# Patient Record
Sex: Male | Born: 1935 | Race: White | Hispanic: No | Marital: Single | Smoking: Never smoker
Health system: Southern US, Community
[De-identification: ages and names within clinical notes are randomized; demographics above are authoritative.]

## PROBLEM LIST (undated history)

## (undated) DIAGNOSIS — I639 Cerebral infarction, unspecified: Secondary | ICD-10-CM

## (undated) HISTORY — PX: VASCULAR SURGERY: SHX849

---

## 2001-10-28 ENCOUNTER — Encounter: Payer: Self-pay | Admitting: Urology

## 2001-10-28 ENCOUNTER — Encounter: Admission: RE | Admit: 2001-10-28 | Discharge: 2001-10-28 | Payer: Self-pay | Admitting: Urology

## 2005-04-05 ENCOUNTER — Emergency Department: Payer: Self-pay | Admitting: Emergency Medicine

## 2005-04-15 ENCOUNTER — Emergency Department: Payer: Self-pay | Admitting: Emergency Medicine

## 2005-05-08 ENCOUNTER — Emergency Department: Payer: Self-pay | Admitting: Emergency Medicine

## 2011-09-29 ENCOUNTER — Emergency Department: Payer: Self-pay | Admitting: *Deleted

## 2011-10-02 ENCOUNTER — Emergency Department: Payer: Self-pay | Admitting: Emergency Medicine

## 2011-10-02 LAB — COMPREHENSIVE METABOLIC PANEL
Albumin: 4.1 g/dL (ref 3.4–5.0)
Alkaline Phosphatase: 63 U/L (ref 50–136)
Anion Gap: 8 (ref 7–16)
BUN: 17 mg/dL (ref 7–18)
Bilirubin,Total: 1.5 mg/dL — ABNORMAL HIGH (ref 0.2–1.0)
Calcium, Total: 9.6 mg/dL (ref 8.5–10.1)
Chloride: 106 mmol/L (ref 98–107)
Co2: 26 mmol/L (ref 21–32)
Creatinine: 1.42 mg/dL — ABNORMAL HIGH (ref 0.60–1.30)
EGFR (African American): >60
EGFR (Non-African Amer.): 50 — ABNORMAL LOW
Glucose: 102 mg/dL — ABNORMAL HIGH (ref 65–99)
Osmolality: 281 (ref 275–301)
Potassium: 4.2 mmol/L (ref 3.5–5.1)
SGOT(AST): 25 U/L (ref 15–37)
SGPT (ALT): 24 U/L
Sodium: 140 mmol/L (ref 136–145)
Total Protein: 7.4 g/dL (ref 6.4–8.2)

## 2011-10-02 LAB — CBC
HCT: 38.4 % — ABNORMAL LOW (ref 40.0–52.0)
HGB: 13.1 g/dL (ref 13.0–18.0)
MCH: 32.2 pg (ref 26.0–34.0)
MCHC: 34 g/dL (ref 32.0–36.0)
MCV: 95 fL (ref 80–100)
Platelet: 175 10*3/uL (ref 150–440)
RBC: 4.07 10*6/uL — ABNORMAL LOW (ref 4.40–5.90)
RDW: 13.9 % (ref 11.5–14.5)
WBC: 9.4 10*3/uL (ref 3.8–10.6)

## 2011-10-02 LAB — LIPASE, BLOOD: Lipase: 96 U/L (ref 73–393)

## 2011-10-04 ENCOUNTER — Emergency Department: Payer: Self-pay | Admitting: Emergency Medicine

## 2014-10-01 ENCOUNTER — Ambulatory Visit
Admit: 2014-10-01 | Disposition: A | Discharge status: Home / Self Care | Payer: Self-pay | Attending: Family Medicine | Admitting: Family Medicine

## 2014-10-03 ENCOUNTER — Ambulatory Visit
Admit: 2014-10-03 | Disposition: A | Discharge status: Home / Self Care | Payer: Self-pay | Attending: Family Medicine | Admitting: Family Medicine

## 2014-10-07 ENCOUNTER — Emergency Department
Admit: 2014-10-07 | Disposition: A | Discharge status: Home / Self Care | Payer: Self-pay | Admitting: Emergency Medicine

## 2014-10-11 ENCOUNTER — Ambulatory Visit
Admit: 2014-10-11 | Disposition: A | Discharge status: Home / Self Care | Payer: Self-pay | Attending: Family Medicine | Admitting: Family Medicine

## 2014-10-15 ENCOUNTER — Ambulatory Visit
Admit: 2014-10-15 | Disposition: A | Discharge status: Home / Self Care | Payer: Self-pay | Attending: Family Medicine | Admitting: Family Medicine

## 2014-10-16 ENCOUNTER — Ambulatory Visit
Admit: 2014-10-16 | Disposition: A | Discharge status: Home / Self Care | Payer: Self-pay | Attending: Family Medicine | Admitting: Family Medicine

## 2016-11-21 ENCOUNTER — Emergency Department
Admission: EM | Admit: 2016-11-21 | Discharge: 2016-11-21 | Disposition: A | Discharge status: Home / Self Care | Payer: Medicare Other | Attending: Emergency Medicine | Admitting: Emergency Medicine

## 2016-11-21 DIAGNOSIS — W57XXXA Bitten or stung by nonvenomous insect and other nonvenomous arthropods, initial encounter: Secondary | ICD-10-CM | POA: Insufficient documentation

## 2016-11-21 DIAGNOSIS — Y9389 Activity, other specified: Secondary | ICD-10-CM | POA: Diagnosis not present

## 2016-11-21 DIAGNOSIS — Y9289 Other specified places as the place of occurrence of the external cause: Secondary | ICD-10-CM | POA: Diagnosis not present

## 2016-11-21 DIAGNOSIS — S70362A Insect bite (nonvenomous), left thigh, initial encounter: Secondary | ICD-10-CM | POA: Insufficient documentation

## 2016-11-21 DIAGNOSIS — Y999 Unspecified external cause status: Secondary | ICD-10-CM | POA: Diagnosis not present

## 2016-11-21 MED ORDER — DOXYCYCLINE HYCLATE 100 MG PO TABS
ORAL_TABLET | ORAL | Status: AC
  Administered 2016-11-21: 200 mg via ORAL
  Filled 2016-11-21: qty 2

## 2016-11-21 MED ORDER — DOXYCYCLINE HYCLATE 100 MG PO TABS
200.0000 mg | ORAL_TABLET | Freq: Once | ORAL | Status: AC
  Administered 2016-11-21: 200 mg via ORAL

## 2016-11-21 NOTE — ED Provider Notes (Signed)
[facility] Emergency Department Provider Note  ____________________________________________   First MD Initiated Contact with Patient 11/21/16 0104     (approximate)  I have reviewed the triage vital signs and the nursing notes.   HISTORY  Chief Complaint Insect Bite    HPI Randy Lester is a 83 y.o. male who comes to the emergency department because he felt a tick in his left upper thigh and wants it removed. He said he has been working outside recently several days ago he picked attack off his left upper arm. He's had no fevers or chills. No headache. No nausea or vomiting. No rash.   No past medical history on file.  There are no active problems to display for this patient.   No past surgical history on file.  Prior to Admission medications   Not on File    Allergies Penicillins  No family history on file.  Social History Social History  Substance Use Topics  . Smoking status: Not on file  . Smokeless tobacco: Not on file  . Alcohol use Not on file    Review of Systems Constitutional: No fever/chills ENT: No sore throat. Cardiovascular: Denies chest pain. Respiratory: Denies shortness of breath. Gastrointestinal: No abdominal pain.  No nausea, no vomiting.  No diarrhea.  No constipation. Musculoskeletal: Negative for back pain. Neurological: Negative for headaches   ____________________________________________   PHYSICAL EXAM:  VITAL SIGNS: ED Triage Vitals  Enc Vitals Group     BP 11/21/16 0057 (!) 162/73     Pulse Rate 11/21/16 0057 72     Resp 11/21/16 0057 20     Temp 11/21/16 0057 98.5 F (36.9 C)     Temp Source 11/21/16 0057 Oral     SpO2 11/21/16 0057 97 %     Weight 11/21/16 0055 148 lb (67.1 kg)     Height 11/21/16 0055 5\' 6"  (1.676 m)     Head Circumference --      Peak Flow --      Pain Score --      Pain Loc --      Pain Edu? --      Excl. in GC? --     Constitutional: Alert and oriented  x 4 well appearing nontoxic no diaphoresis speaks in full, clear sentences Head: Atraumatic. Nose: No congestion/rhinnorhea. Mouth/Throat: No trismus Neck: No stridor.   Cardiovascular: The rate and rhythm no murmurs Respiratory: Normal respiratory effort.  No retractions. Gastrointestinal: Soft nontender Neurologic:  Normal speech and language. No gross focal neurologic deficits are appreciated.  Skin:  Slight area of excoriation of her left upper thigh no foreign body identified no insect    ____________________________________________  LABS (all labs ordered are listed, but only abnormal results are displayed)  Labs Reviewed - No data to display   __________________________________________  EKG   ____________________________________________  RADIOLOGY   ____________________________________________   PROCEDURES  Procedure(s) performed: no  Procedures  Critical Care performed: no  Observation: no ____________________________________________   INITIAL IMPRESSION / ASSESSMENT AND PLAN / ED COURSE  Pertinent labs & imaging results that were available during my care of the patient were reviewed by me and considered in my medical decision making (see chart for details).  The patient no longer has an insect or a tick on his thigh. He may very well and had one because he scratched earlier and he may have inadvertently removed it. Regardless he says he clearly is had multiple ticks recently so I  will give him a prophylactic dose of doxycycline and strict return precautions.      ____________________________________________   FINAL CLINICAL IMPRESSION(S) / ED DIAGNOSES  Final diagnoses:  Tick bite, initial encounter      NEW MEDICATIONS STARTED DURING THIS VISIT:  There are no discharge medications for this patient.    Note:  This document was prepared using Dragon voice recognition software and may include unintentional dictation errors.        Merrily Brittle, MD 11/21/16 [phone]

## 2016-11-21 NOTE — ED Triage Notes (Signed)
Patient ambulatory to triage with steady gait, without difficulty or distress noted; pt reports here for tick on left thigh

## 2016-11-21 NOTE — Discharge Instructions (Signed)
Please establish care with a primary care physician as needed for recheck. Return to the emergency department for any new or worsening symptoms such as fevers chills chest pain shortness of breath rash or for any other concerns.  It was a pleasure to take care of you today, and thank you for coming to our emergency department.  If you have any questions or concerns before leaving please ask the nurse to grab me and I'm more than happy to go through your aftercare instructions again.  If you were prescribed any opioid pain medication today such as Norco, Vicodin, Percocet, morphine, hydrocodone, or oxycodone please make sure you do not drive when you are taking this medication as it can alter your ability to drive safely.  If you have any concerns once you are home that you are not improving or are in fact getting worse before you can make it to your follow-up appointment, please do not hesitate to call 911 and come back for further evaluation.  Merrily BrittleNeil Clanton Emanuelson MD

## 2017-03-30 ENCOUNTER — Encounter: Payer: Self-pay | Admitting: *Deleted

## 2017-03-30 ENCOUNTER — Ambulatory Visit
Admission: EM | Admit: 2017-03-30 | Discharge: 2017-03-30 | Disposition: A | Discharge status: Home / Self Care | Payer: Medicare Other

## 2017-03-30 DIAGNOSIS — T148XXA Other injury of unspecified body region, initial encounter: Secondary | ICD-10-CM

## 2017-03-30 HISTORY — DX: Cerebral infarction, unspecified: I63.9

## 2017-03-30 NOTE — Discharge Instructions (Signed)
Bacitracin antibiotic ointment twice daily

## 2017-03-30 NOTE — ED Triage Notes (Signed)
Pt had facial skin surgery yesterday afternoon. Site just in front of right ear persistent bleeding.

## 2017-03-30 NOTE — ED Provider Notes (Signed)
[facility]    CSN: [id number] Arrival date & time: 03/30/17  1444     History   Chief Complaint Chief Complaint  Patient presents with  . Wound Dehiscence    HPI Randy Lester is a 83 y.o. male.   83 yo male with a c/o bleeding from site of facial skin procedure done yesterday. States he saw the dermatologist yesterday and had a "scraping" done on a skin lesion on the right temple area. Patient takes aspirin regularly and had only stopped it for 4 days. States site started bleeding around noon today and has not stopped with pressure to the area.    The history is provided by the patient.    Past Medical History:  Diagnosis Date  . Stroke [facility])     There are no active problems to display for this patient.   Past Surgical History:  Procedure Laterality Date  . VASCULAR SURGERY         Home Medications    Prior to Admission medications   Medication Sig Start Date End Date Taking? Authorizing Provider  aspirin EC 81 MG tablet Take 81 mg by mouth daily.   Yes [provider]  atorvastatin (LIPITOR) 40 MG tablet Take 40 mg by mouth daily.   Yes [provider]  tamsulosin (FLOMAX) 0.4 MG CAPS capsule Take 0.4 mg by mouth.   Yes [provider]    Family History History reviewed. No pertinent family history.  Social History Social History  Substance Use Topics  . Smoking status: Never Smoker  . Smokeless tobacco: Never Used  . Alcohol use No     Allergies   Penicillins   Review of Systems Review of Systems   Physical Exam Triage Vital Signs ED Triage Vitals  Enc Vitals Group     BP 03/30/17 1543 (!) 162/65     Pulse Rate 03/30/17 1543 71     Resp 03/30/17 1543 16     Temp 03/30/17 1543 97.6 F (36.4 C)     Temp Source 03/30/17 1543 Oral     SpO2 03/30/17 1543 98 %     Weight 03/30/17 1545 145 lb (65.8 kg)     Height 03/30/17 1545  (1.676 m)     Head Circumference --      Peak Flow --    Pain Score --      Pain Loc --      Pain Edu? --      Excl. in GC? --    No data found.   Updated Vital Signs BP (!) 162/65 (BP Location: Left Arm)   Pulse 71   Temp 97.6 F (36.4 C) (Oral)   Resp 16   Ht  (1.676 m)   Wt 145 lb (65.8 kg)   SpO2 98%   BMI 23.40 kg/m   Visual Acuity Right Eye Distance:   Left Eye Distance:   Bilateral Distance:    Right Eye Near:   Left Eye Near:    Bilateral Near:     Physical Exam  Constitutional: He appears well-developed and well-nourished. No distress.  Skin: He is not diaphoretic.  Right lateral, upper cheek/temple area with deep skin open wound actively oozing blood  Nursing note and vitals reviewed.    UC Treatments / Results  Labs (all labs ordered are listed, but only abnormal results are displayed) Labs Reviewed - No data to display  EKG  EKG Interpretation None  Radiology No results found.  Procedures Procedures (including critical care time)  Medications Ordered in UC Medications - No data to display   Initial Impression / Assessment and Plan / UC Course  I have reviewed the triage vital signs and the nursing notes.  Pertinent labs & imaging results that were available during my care of the patient were reviewed by me and considered in my medical decision making (see chart for details).       Final Clinical Impressions(s) / UC Diagnoses   Final diagnoses:  Bleeding from wound    New Prescriptions Discharge Medication List as of 03/30/2017  4:18 PM     1. Bleeding stopped with application of silver nitrate. Gauze pressure dressing applied. Wound care instructions given. Follow up with dermatologist.   Controlled Substance Prescriptions [place] Controlled Substance Registry consulted? Not Applicable   Payton Mccallum, MD 03/30/17 [phone]

## 2017-06-24 ENCOUNTER — Other Ambulatory Visit: Payer: Self-pay

## 2017-06-24 ENCOUNTER — Ambulatory Visit
Admission: EM | Admit: 2017-06-24 | Discharge: 2017-06-24 | Disposition: A | Discharge status: Home / Self Care | Payer: Medicare Other | Attending: Family Medicine | Admitting: Family Medicine

## 2017-06-24 ENCOUNTER — Encounter: Payer: Self-pay | Admitting: *Deleted

## 2017-06-24 DIAGNOSIS — W19XXXA Unspecified fall, initial encounter: Secondary | ICD-10-CM | POA: Diagnosis not present

## 2017-06-24 DIAGNOSIS — S41112A Laceration without foreign body of left upper arm, initial encounter: Secondary | ICD-10-CM | POA: Diagnosis not present

## 2017-06-24 MED ORDER — MUPIROCIN 2 % EX OINT: TOPICAL_OINTMENT | CUTANEOUS | 0 refills | Status: DC

## 2017-06-24 NOTE — Discharge Instructions (Signed)
Use medication as prescribed. Keep clean.  Follow up with your primary care physician this week as needed. Return to Urgent care for redness, swelling, drainage, new or worsening concerns.

## 2017-06-24 NOTE — ED Provider Notes (Signed)
[facility] ____________________________________________  Time seen: Approximately 4:37 PM  I have reviewed the triage vital signs and the nursing notes.   HISTORY  Chief Complaint Laceration   HPI Randy Lester is a 83 y.o. male present with family at bedside for evaluation of left upper arm skin tear.  Patient and family reports that yesterday afternoon patient was laying on the couch in the process of getting up to open the door, his feet "got tangled up "causing him to trip and hit his left upper forearm on the coffee table.  Patient states that he did not fall from standing but from getting up from the couch.  States minimal pain to left upper arm and only at the area of skin tear.  Denies any other pain or injuries.  No head injury or loss of conscious.  Reports his continue with normal behavior and normal ambulatory status since.  Patient reports that he pulled some of the skin off yesterday.  Family report that they clean the area yesterday and applied dressing, but reports today when rechecking it had saturated the bandage and began bleeding again when trying to change the bandage, prompting for evaluation.  Patient states unsure of tetanus immunization status, but states that he believes his tetanus immunization is up-to-date.  Patient again reports he fell only because of tripping.  Denies syncope or near syncope.  Reports otherwise feels well.  Thea AlkenKeyserling, Thomas C, MD: PCP    Past Medical History:  Diagnosis Date  . Stroke [facility])     There are no active problems to display for this patient.   Past Surgical History:  Procedure Laterality Date  . VASCULAR SURGERY       No current facility-administered medications for this encounter.   Current Outpatient Medications:  .  amiodarone (PACERONE) 200 MG tablet, Take 200 mg by mouth daily., Disp: , Rfl:  .  apixaban (ELIQUIS) 5 MG TABS tablet, Take 5 mg by mouth daily., Disp: , Rfl:  .  atorvastatin  (LIPITOR) 40 MG tablet, Take 40 mg by mouth daily., Disp: , Rfl:  .  finasteride (PROSCAR) 5 MG tablet, Take 5 mg by mouth daily., Disp: , Rfl:  .  tamsulosin (FLOMAX) 0.4 MG CAPS capsule, Take 0.4 mg by mouth., Disp: , Rfl:  .  mupirocin ointment (BACTROBAN) 2 %, Apply two times a day for 7 days., Disp: 22 g, Rfl: 0  Allergies Penicillins  History reviewed. No pertinent family history.  Social History Social History   Tobacco Use  . Smoking status: Never Smoker  . Smokeless tobacco: Never Used  Substance Use Topics  . Alcohol use: No  . Drug use: No    Review of Systems Constitutional: No fever/chills Eyes: No visual changes. Cardiovascular: Denies chest pain. Respiratory: Denies shortness of breath. Gastrointestinal: No abdominal pain.   Musculoskeletal: Negative for back pain. Skin: Negative for rash. As above.  Neurological: Negative for headaches, focal weakness or numbness.   ____________________________________________   PHYSICAL EXAM:  VITAL SIGNS: ED Triage Vitals  Enc Vitals Group     BP 06/24/17 1536 (!) 112/49     Pulse Rate 06/24/17 1536 75     Resp 06/24/17 1536 16     Temp 06/24/17 1536 97.6 F (36.4 C)     Temp src --      SpO2 06/24/17 1536 96 %     Weight 06/24/17 1537 148 lb (67.1 kg)     Height 06/24/17 1537 5\' 5"  (1.651 m)  Head Circumference --      Peak Flow --      Pain Score 06/24/17 1537 0     Pain Loc --      Pain Edu? --      Excl. in GC? --     Constitutional: Alert and oriented. Well appearing and in no acute distress. Eyes: Conjunctivae are normal. ENT      Head: Normocephalic and atraumatic. Cardiovascular: Normal rate, regular rhythm. Grossly normal heart sounds.  Good peripheral circulation. Respiratory: Normal respiratory effort without tachypnea nor retractions. Breath sounds are clear and equal bilaterally. No wheezes, rales, rhonchi. Gastrointestinal: Soft and nontender. Musculoskeletal:   No midline cervical,  thoracic or lumbar tenderness to palpation. Bilateral distal radial pulses equal and easily palpated.  Bilateral hand grip strong and equal.      Right lower leg:  No tenderness or edema.      Left lower leg:  No tenderness or edema.  Neurologic:  Normal speech and language. No gross focal neurologic deficits are appreciated. Speech is normal.  No paresthesias. Skin:  Skin is warm, dry.      Except: See image above.  Patient with large superficial skin tear to distal lateral humerus, minimal tenderness to direct skin tear location, no bony tenderness, full range of motion to left upper extremity without pain full range of motion, no foreign bodies visualized, no active bleeding, no drainage,no surrounding erythema.  Psychiatric: Mood and affect are normal. Speech and behavior are normal. Patient exhibits appropriate insight and judgment   ___________________________________________   LABS (all labs ordered are listed, but only abnormal results are displayed)  Labs Reviewed - No data to display   PROCEDURES Procedures  Procedure(s) performed:  Procedure explained and verbal consent obtained. Consent: Verbal consent obtained. Written consent not obtained. Risks and benefits: risks, benefits and alternatives were discussed Patient identity confirmed: verbally with patient and hospital-assigned identification number  Consent given by: patient   Laceration Repair Location: left arm Length: approximately 7x8 cm Foreign bodies: no foreign bodies Tendon involvement: none Nerve involvement: none Preparation: Patient was prepped and draped in the usual sterile fashion. Anesthesia: none Irrigation solution: saline and betadine Irrigation method: jet lavage Amount of cleaning: copious Sterile forceps also utilized to unroll raveled thin skin tear skin to reapproximate Repaired: dermabond to lateral edges Approximation: loose Patient tolerate well. Wound well approximated post repair.    Antibiotic ointment and dressing applied.  Wound care instructions provided.  Observe for any signs of infection or other problems.      INITIAL IMPRESSION / ASSESSMENT AND PLAN / ED COURSE  Pertinent labs & imaging results that were available during my care of the patient were reviewed by me and considered in my medical decision making (see chart for details).  Very well-appearing patient.  Presenting with family at bedside post mechanical fall yesterday at home.  Large skin tear as above.  No bony tenderness and full range of motion present.  Doubt bony abnormality, patient agrees and declines x-ray.  Wound copiously cleaned and irrigated.  Wound margins at skin tear revised and reapproximated as able, but majority of skin pulled off by patient, and Dermabond used.  Topical antibiotic and dressing applied.  Will treat with topical Bactroban.  Encouraged rest, elevation, good wound care.  Discussed strict follow-up and return parameters.Discussed indication, risks and benefits of medications with patient.  By care everywhere, noted at [facility] 02-07-2016 tetanus immunization was updated.  Discussed follow up with Primary care physician  this week. Discussed follow up and return parameters including no resolution or any worsening concerns. Patient verbalized understanding and agreed to plan.   ____________________________________________   FINAL CLINICAL IMPRESSION(S) / ED DIAGNOSES  Final diagnoses:  Skin tear of left upper arm without complication, initial encounter     ED Discharge Orders        Ordered    mupirocin ointment (BACTROBAN) 2 %     06/24/17 1714       Note: This dictation was prepared with Dragon dictation along with smaller phrase technology. Any transcriptional errors that result from this process are unintentional.         Renford DillsMiller, Pleshette Tomasini, NP 06/24/17 1746

## 2017-06-24 NOTE — ED Triage Notes (Signed)
Patient fell against a table causing a skin laceration on his left upper arm yesterday. Patient removed the skin flap.

## 2017-10-19 ENCOUNTER — Ambulatory Visit
Admit: 2017-10-19 | Discharge: 2017-10-19 | Disposition: A | Discharge status: Home / Self Care | Payer: Medicare Other | Attending: Family Medicine | Admitting: Family Medicine

## 2017-10-19 ENCOUNTER — Encounter: Payer: Self-pay | Admitting: Emergency Medicine

## 2017-10-19 ENCOUNTER — Other Ambulatory Visit: Payer: Self-pay

## 2017-10-19 ENCOUNTER — Ambulatory Visit
Admission: EM | Admit: 2017-10-19 | Discharge: 2017-10-19 | Disposition: A | Discharge status: Home / Self Care | Payer: Medicare Other | Attending: Family Medicine | Admitting: Family Medicine

## 2017-10-19 DIAGNOSIS — Z87440 Personal history of urinary (tract) infections: Secondary | ICD-10-CM | POA: Insufficient documentation

## 2017-10-19 DIAGNOSIS — M436 Torticollis: Secondary | ICD-10-CM | POA: Insufficient documentation

## 2017-10-19 DIAGNOSIS — T148XXA Other injury of unspecified body region, initial encounter: Secondary | ICD-10-CM | POA: Diagnosis present

## 2017-10-19 DIAGNOSIS — Z8673 Personal history of transient ischemic attack (TIA), and cerebral infarction without residual deficits: Secondary | ICD-10-CM | POA: Insufficient documentation

## 2017-10-19 DIAGNOSIS — T07XXXA Unspecified multiple injuries, initial encounter: Secondary | ICD-10-CM

## 2017-10-19 DIAGNOSIS — Z880 Allergy status to penicillin: Secondary | ICD-10-CM | POA: Insufficient documentation

## 2017-10-19 DIAGNOSIS — S0512XA Contusion of eyeball and orbital tissues, left eye, initial encounter: Secondary | ICD-10-CM | POA: Insufficient documentation

## 2017-10-19 DIAGNOSIS — Z79899 Other long term (current) drug therapy: Secondary | ICD-10-CM | POA: Diagnosis not present

## 2017-10-19 DIAGNOSIS — I6782 Cerebral ischemia: Secondary | ICD-10-CM | POA: Insufficient documentation

## 2017-10-19 DIAGNOSIS — S40212A Abrasion of left shoulder, initial encounter: Secondary | ICD-10-CM

## 2017-10-19 DIAGNOSIS — M5021 Other cervical disc displacement,  high cervical region: Secondary | ICD-10-CM | POA: Diagnosis not present

## 2017-10-19 DIAGNOSIS — Z8546 Personal history of malignant neoplasm of prostate: Secondary | ICD-10-CM | POA: Diagnosis not present

## 2017-10-19 DIAGNOSIS — S59902A Unspecified injury of left elbow, initial encounter: Secondary | ICD-10-CM | POA: Diagnosis not present

## 2017-10-19 DIAGNOSIS — W19XXXA Unspecified fall, initial encounter: Secondary | ICD-10-CM | POA: Diagnosis not present

## 2017-10-19 DIAGNOSIS — S0990XA Unspecified injury of head, initial encounter: Secondary | ICD-10-CM | POA: Insufficient documentation

## 2017-10-19 DIAGNOSIS — Y92008 Other place in unspecified non-institutional (private) residence as the place of occurrence of the external cause: Secondary | ICD-10-CM | POA: Diagnosis not present

## 2017-10-19 DIAGNOSIS — W1830XA Fall on same level, unspecified, initial encounter: Secondary | ICD-10-CM | POA: Insufficient documentation

## 2017-10-19 DIAGNOSIS — G319 Degenerative disease of nervous system, unspecified: Secondary | ICD-10-CM | POA: Diagnosis not present

## 2017-10-19 DIAGNOSIS — M5030 Other cervical disc degeneration, unspecified cervical region: Secondary | ICD-10-CM | POA: Diagnosis not present

## 2017-10-19 DIAGNOSIS — E785 Hyperlipidemia, unspecified: Secondary | ICD-10-CM | POA: Diagnosis not present

## 2017-10-19 DIAGNOSIS — I480 Paroxysmal atrial fibrillation: Secondary | ICD-10-CM | POA: Diagnosis not present

## 2017-10-19 DIAGNOSIS — S59901A Unspecified injury of right elbow, initial encounter: Secondary | ICD-10-CM | POA: Diagnosis not present

## 2017-10-19 MED ORDER — MUPIROCIN 2 % EX OINT: 1.0000 "application " | TOPICAL_OINTMENT | Freq: Two times a day (BID) | CUTANEOUS | 0 refills | Status: AC

## 2017-10-19 NOTE — ED Triage Notes (Signed)
Called insurance co @ [phone] for pre-cert of CTs. No pre-cert required per automated voice system.

## 2017-10-19 NOTE — ED Provider Notes (Signed)
[facility]  CSN: [id number] Arrival date & time: 10/19/17  1554  History   Chief Complaint Chief Complaint  Patient presents with  . Fall    DOA 10/17/17   HPI  84 year old male who is currently on Eliquis presents for evaluation following a fall.  Patient states that he fell on 4/24.  He fell on his carport.  He fell on cement and injured his head.  He also suffered multiple wounds: Bilateral wrists & elbows, left anterior shoulder.  Patient states that he dressed his wounds but he continues to have some bleeding.  He denies headache.  No neck pain.  No reports of loss of consciousness.  He is primarily concerned about his wounds as opposed to his head injury.  He states that he is feeling well currently.  Additionally, he has a black eye (left).  No other associated symptoms.  No other complaints.  PMH: Hyperlipidemia, paroxysmal A. fib, history of UTI, history of prostate cancer, history of TIA  Surgical Hx:  CAROTID ARTERY ANGIOPLASTY     KNEE SURGERY   left  Thumb and Finger surgery     EYE SURGERY   cataracts   Home Medications    Prior to Admission medications   Medication Sig Start Date End Date Taking? Authorizing Provider  amiodarone (PACERONE) 200 MG tablet Take 200 mg by mouth daily.   Yes [provider]  apixaban (ELIQUIS) 5 MG TABS tablet Take 5 mg by mouth daily.   Yes [provider]  atorvastatin (LIPITOR) 40 MG tablet Take 40 mg by mouth daily.   Yes [provider]  finasteride (PROSCAR) 5 MG tablet Take 5 mg by mouth daily.   Yes [provider]  tamsulosin (FLOMAX) 0.4 MG CAPS capsule Take 0.4 mg by mouth.   Yes [provider]  mupirocin ointment (BACTROBAN) 2 % Apply 1 application topically 2 (two) times daily for 7 days. 10/19/17 10/26/17  Tommie Samsook, Brynlee Pennywell G, DO   Family History COPD Sister    Dementia Sister     Heart disease Brother    Cancer Sister     Social History Social History     Tobacco Use  . Smoking status: Never Smoker  . Smokeless tobacco: Never Used  Substance Use Topics  . Alcohol use: No  . Drug use: No    Allergies   Penicillins   Review of Systems Review of Systems  HENT:       Head injury.   Eyes:       Left black eye.  Musculoskeletal: Negative for neck pain.  Skin: Positive for wound.   Physical Exam Triage Vital Signs ED Triage Vitals  Enc Vitals Group     BP 10/19/17 1607 (!) 135/59     Pulse Rate 10/19/17 1607 64     Resp 10/19/17 1607 16     Temp 10/19/17 1607 (!) 97.5 F (36.4 C)     Temp Source 10/19/17 1607 Oral     SpO2 10/19/17 1607 97 %     Weight 10/19/17 1608 148 lb (67.1 kg)     Height 10/19/17 1608 5\' 6"  (1.676 m)     Head Circumference --      Peak Flow --      Pain Score 10/19/17 1608 0     Pain Loc --      Pain Edu? --      Excl. in GC? --    Updated Vital Signs BP (!) 135/59 (  BP Location: Right Arm)   Pulse 64   Temp (!) 97.5 F (36.4 C) (Oral)   Resp 16   Ht 5\' 6"  (1.676 m)   Wt 148 lb (67.1 kg)   SpO2 97%   BMI 23.89 kg/m   Physical Exam  Constitutional: He is oriented to person, place, and time. He appears well-developed. No distress.  HENT:  Head: Normocephalic and atraumatic.  Nose: Nose normal.  Mouth/Throat: Oropharynx is clear and moist.  Normal TMs bilaterally.  Eyes:  Ecchymosis of the left eye.  Neck: Normal range of motion. Neck supple.  No spinal tenderness.  Cardiovascular: Normal rate and regular rhythm.  Pulmonary/Chest: Effort normal and breath sounds normal. He has no wheezes. He has no rales.  Abdominal: Soft. He exhibits no distension. There is no tenderness.  Neurological: He is alert and oriented to person, place, and time.  Extraocular movements appear to be grossly intact.  Normal upper extremity muscle strength.  No apparent focal neurological deficits.  Skin:  Patient has several skin tears: left & right wrist, left & right elbows.  Patient also has an abrasion  of the left anterior shoulder.  Psychiatric: He has a normal mood and affect. His behavior is normal.  Nursing note and vitals reviewed.  UC Treatments / Results  Labs (all labs ordered are listed, but only abnormal results are displayed) Labs Reviewed - No data to display  EKG None Radiology Ct Head Wo Contrast  Result Date: 10/19/2017 CLINICAL DATA:  Larey Seat on Wednesday evening, soreness all over, stiff neck, LEFT eye contusion, history of stroke EXAM: CT HEAD WITHOUT CONTRAST CT MAXILLOFACIAL WITHOUT CONTRAST CT CERVICAL SPINE WITHOUT CONTRAST TECHNIQUE: Multidetector CT imaging of the head, cervical spine, and maxillofacial structures were performed using the standard protocol without intravenous contrast. Multiplanar CT image reconstructions of the cervical spine and maxillofacial structures were also generated. Right side of face marked with BB. COMPARISON:  CT head 10/07/2014 FINDINGS: CT HEAD FINDINGS Brain: Generalized atrophy. Ex vacuo dilatation of the frontal horn of the RIGHT lateral ventricle secondary to large old basal ganglia infarct. Small vessel chronic ischemic changes of deep cerebral white matter. No midline shift. No intracranial hemorrhage, mass lesion, or evidence of acute infarction. No extra-axial fluid collections. Vascular: Atherosclerotic calcification of internal carotid and vertebral arteries bilaterally at skull base. Skull: Demineralized but intact Other: N/A CT MAXILLOFACIAL FINDINGS Osseous: Facial bones intact. TMJ alignment normal bilaterally. No facial bone fractures identified. Small benign-appearing sclerotic focus in the LEFT frontal bone. Orbits: Bony orbits intact.  Intraorbital soft tissue planes clear. Sinuses: Paranasal sinuses, mastoid air cells and middle ear cavities clear Soft tissues: No significant soft tissue abnormalities. Linear high attenuation foreign body at the LEFT carotid artery likely synthetic patch from prior carotid surgery. CT CERVICAL  SPINE FINDINGS Alignment: Minimal retrolisthesis at C3-C4. Remaining alignments normal. Skull base and vertebrae: Bones demineralized. Visualized skull base intact. Scattered facet degenerative changes. Vertebral body heights maintained without fracture or subluxation. Encroachment upon multiple cervical neural foramina bilaterally by uncovertebral spurs. Soft tissues and spinal canal: Prevertebral soft tissues normal thickness. Remaining cervical soft tissues unremarkable. Disc levels: Mildly bulging disc at C2-C3. Central disc herniation questioned at C3-C4. Upper chest: Lung apices not visualized. Other: Atherosclerotic calcifications within the carotid systems bilaterally with evidence of prior surgical repair of the LEFT carotid. IMPRESSION: Atrophy with small vessel chronic ischemic changes of deep cerebral white matter. Old RIGHT basal ganglia lacunar infarct with ex vacuo dilatation of the RIGHT lateral  ventricle. No acute intracranial abnormalities. No acute facial bone abnormalities. Multilevel degenerative disc and facet disease changes of the cervical spine as above. No acute cervical spine abnormalities. Electronically Signed   By: Ulyses Southward M.D.   On: 10/19/2017 18:15   Ct Cervical Spine Wo Contrast  Result Date: 10/19/2017 CLINICAL DATA:  Larey Seat on Wednesday evening, soreness all over, stiff neck, LEFT eye contusion, history of stroke EXAM: CT HEAD WITHOUT CONTRAST CT MAXILLOFACIAL WITHOUT CONTRAST CT CERVICAL SPINE WITHOUT CONTRAST TECHNIQUE: Multidetector CT imaging of the head, cervical spine, and maxillofacial structures were performed using the standard protocol without intravenous contrast. Multiplanar CT image reconstructions of the cervical spine and maxillofacial structures were also generated. Right side of face marked with BB. COMPARISON:  CT head 10/07/2014 FINDINGS: CT HEAD FINDINGS Brain: Generalized atrophy. Ex vacuo dilatation of the frontal horn of the RIGHT lateral ventricle  secondary to large old basal ganglia infarct. Small vessel chronic ischemic changes of deep cerebral white matter. No midline shift. No intracranial hemorrhage, mass lesion, or evidence of acute infarction. No extra-axial fluid collections. Vascular: Atherosclerotic calcification of internal carotid and vertebral arteries bilaterally at skull base. Skull: Demineralized but intact Other: N/A CT MAXILLOFACIAL FINDINGS Osseous: Facial bones intact. TMJ alignment normal bilaterally. No facial bone fractures identified. Small benign-appearing sclerotic focus in the LEFT frontal bone. Orbits: Bony orbits intact.  Intraorbital soft tissue planes clear. Sinuses: Paranasal sinuses, mastoid air cells and middle ear cavities clear Soft tissues: No significant soft tissue abnormalities. Linear high attenuation foreign body at the LEFT carotid artery likely synthetic patch from prior carotid surgery. CT CERVICAL SPINE FINDINGS Alignment: Minimal retrolisthesis at C3-C4. Remaining alignments normal. Skull base and vertebrae: Bones demineralized. Visualized skull base intact. Scattered facet degenerative changes. Vertebral body heights maintained without fracture or subluxation. Encroachment upon multiple cervical neural foramina bilaterally by uncovertebral spurs. Soft tissues and spinal canal: Prevertebral soft tissues normal thickness. Remaining cervical soft tissues unremarkable. Disc levels: Mildly bulging disc at C2-C3. Central disc herniation questioned at C3-C4. Upper chest: Lung apices not visualized. Other: Atherosclerotic calcifications within the carotid systems bilaterally with evidence of prior surgical repair of the LEFT carotid. IMPRESSION: Atrophy with small vessel chronic ischemic changes of deep cerebral white matter. 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 No acute intracranial abnormalities. No acute facial bone abnormalities. Multilevel degenerative  disc and facet disease changes of the cervical spine as above. No acute cervical spine abnormalities. Electronically Signed   By: Ulyses Southward M.D.   On: 10/19/2017 18:15   Ct Maxillofacial Wo Contrast  Result Date: 10/19/2017 CLINICAL DATA:  Larey Seat on Wednesday evening, soreness all over, stiff neck, LEFT eye contusion, history of stroke EXAM: CT HEAD WITHOUT CONTRAST CT MAXILLOFACIAL WITHOUT CONTRAST CT CERVICAL SPINE WITHOUT CONTRAST TECHNIQUE: Multidetector CT imaging of the head, cervical spine, and maxillofacial structures were performed using the standard protocol without intravenous contrast. Multiplanar CT image reconstructions of the cervical spine and maxillofacial structures were also generated. Right side of face marked with BB. COMPARISON:  CT head 10/07/2014 FINDINGS: CT HEAD FINDINGS Brain: Generalized atrophy. Ex vacuo dilatation of the frontal horn of the RIGHT lateral ventricle secondary to large old basal ganglia infarct. Small vessel chronic ischemic changes of deep cerebral white matter. No midline shift. No intracranial hemorrhage, mass lesion, or evidence of acute infarction. No extra-axial fluid collections. Vascular: Atherosclerotic calcification of internal carotid and vertebral arteries bilaterally at skull base. Skull: Demineralized but intact  Other: N/A CT MAXILLOFACIAL FINDINGS Osseous: Facial bones intact. TMJ alignment normal bilaterally. No facial bone fractures identified. Small benign-appearing sclerotic focus in the LEFT frontal bone. Orbits: Bony orbits intact.  Intraorbital soft tissue planes clear. Sinuses: Paranasal sinuses, mastoid air cells and middle ear cavities clear Soft tissues: No significant soft tissue abnormalities. Linear high attenuation foreign body at the LEFT carotid artery likely synthetic patch from prior carotid surgery. CT CERVICAL SPINE FINDINGS Alignment: Minimal retrolisthesis at C3-C4. Remaining alignments normal. Skull base and vertebrae: Bones  demineralized. Visualized skull base intact. Scattered facet degenerative changes. Vertebral body heights maintained without fracture or subluxation. Encroachment upon multiple cervical neural foramina bilaterally by uncovertebral spurs. Soft tissues and spinal canal: Prevertebral soft tissues normal thickness. Remaining cervical soft tissues unremarkable. Disc levels: Mildly bulging disc at C2-C3. Central disc herniation questioned at C3-C4. Upper chest: Lung apices not visualized. Other: Atherosclerotic calcifications within the carotid systems bilaterally with evidence of prior surgical repair of the LEFT carotid. IMPRESSION: Atrophy with small vessel chronic ischemic changes of deep cerebral white matter. 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 No acute intracranial abnormalities. No acute facial bone abnormalities. Multilevel degenerative disc and facet disease changes of the cervical spine as above. No acute cervical spine abnormalities. Electronically Signed   By: Ulyses Southward M.D.   On: 10/19/2017 18:15    Procedures Procedures (including critical care time)  Medications Ordered in UC Medications - No data to display   Initial Impression / Assessment and Plan / UC Course  I have reviewed the triage vital signs and the nursing notes.  Pertinent labs & imaging results that were available during my care of the patient were reviewed by me and considered in my medical decision making (see chart for details).    84 year old male presents following a fall.  Patient with multiple wounds.  They were dressed with antibiotic ointment, nonstick, and Coban.  Given injuries, age, and anticoagulant use CT imaging was obtained.  CT imaging was negative.  Final Clinical Impressions(s) / UC Diagnoses   Final diagnoses:  Injury of head, initial encounter  Multiple wounds    ED Discharge Orders        Ordered    mupirocin ointment (BACTROBAN) 2 %   2 times daily     10/19/17 1821     Controlled Substance Prescriptions [place] Controlled Substance Registry consulted? Not Applicable   Anas, Reister, DO 10/19/17 1826

## 2017-10-19 NOTE — Discharge Instructions (Signed)
Keep wounds dressed.  Follow up with your primary.  Take care  Dr. Adriana Simasook

## 2017-10-19 NOTE — ED Triage Notes (Signed)
Patient in today stating that he fell on 10/17/17 and hit his head and elbows on cement. Left elbow is still bleeding and patient hasn't been able to get it to stop.

## 2017-10-22 ENCOUNTER — Ambulatory Visit: Payer: Medicare Other

## 2018-03-06 ENCOUNTER — Other Ambulatory Visit: Payer: Self-pay

## 2018-03-06 ENCOUNTER — Encounter: Payer: Self-pay | Admitting: Emergency Medicine

## 2018-03-06 ENCOUNTER — Ambulatory Visit
Admission: EM | Admit: 2018-03-06 | Discharge: 2018-03-06 | Disposition: A | Discharge status: Home / Self Care | Payer: Medicare Other | Attending: Family Medicine | Admitting: Family Medicine

## 2018-03-06 DIAGNOSIS — W19XXXA Unspecified fall, initial encounter: Secondary | ICD-10-CM | POA: Diagnosis not present

## 2018-03-06 DIAGNOSIS — T148XXA Other injury of unspecified body region, initial encounter: Principal | ICD-10-CM

## 2018-03-06 DIAGNOSIS — L089 Local infection of the skin and subcutaneous tissue, unspecified: Secondary | ICD-10-CM | POA: Diagnosis not present

## 2018-03-06 DIAGNOSIS — S40812A Abrasion of left upper arm, initial encounter: Secondary | ICD-10-CM

## 2018-03-06 MED ORDER — BACITRACIN ZINC 500 UNIT/GM EX OINT: 1.0000 "application " | TOPICAL_OINTMENT | Freq: Two times a day (BID) | CUTANEOUS | 0 refills | Status: DC

## 2018-03-06 MED ORDER — DOXYCYCLINE HYCLATE 100 MG PO TABS: 100.0000 mg | ORAL_TABLET | Freq: Two times a day (BID) | ORAL | 0 refills | Status: DC

## 2018-03-06 NOTE — ED Triage Notes (Signed)
Patient states that he fell at his sisters house on Friday afternoon.  Patient has a skin tear to his left arm.  Patient c/o pain in his left arm.  Patient denies hitting his head.

## 2018-03-06 NOTE — ED Provider Notes (Signed)
[facility]    CSN: [id number] Arrival date & time: 03/06/18  1133     History   Chief Complaint Chief Complaint  Patient presents with  . Fall  . Skin Tear    left arm    HPI Randy Lester is a 84 y.o. male.   84 yo male with a c/o a skin tear on his left upper arm after falling 6 days ago. States has been applying triple antibiotic ointment to the area. Denies any fevers or chills.   The history is provided by the patient.  Fall     Past Medical History:  Diagnosis Date  . Stroke [facility])     There are no active problems to display for this patient.   Past Surgical History:  Procedure Laterality Date  . VASCULAR SURGERY         Home Medications    Prior to Admission medications   Medication Sig Start Date End Date Taking? Authorizing Provider  amiodarone (PACERONE) 200 MG tablet Take 200 mg by mouth daily.   Yes [provider]  aspirin 81 MG chewable tablet Chew 81 mg by mouth daily.   Yes [provider]  atorvastatin (LIPITOR) 40 MG tablet Take 40 mg by mouth daily.   Yes [provider]  finasteride (PROSCAR) 5 MG tablet Take 5 mg by mouth daily.   Yes [provider]  tamsulosin (FLOMAX) 0.4 MG CAPS capsule Take 0.4 mg by mouth.   Yes [provider]  apixaban (ELIQUIS) 5 MG TABS tablet Take 5 mg by mouth daily.    [provider]  bacitracin ointment Apply 1 application topically 2 (two) times daily. 03/06/18   Payton Mccallum, MD  doxycycline (VIBRA-TABS) 100 MG tablet Take 1 tablet (100 mg total) by mouth 2 (two) times daily. 03/06/18   Payton Mccallum, MD    Family History History reviewed. No pertinent family history.  Social History Social History   Tobacco Use  . Smoking status: Never Smoker  . Smokeless tobacco: Never Used  Substance Use Topics  . Alcohol use: No  . Drug use: No     Allergies   Penicillins   Review of Systems Review of Systems   Physical  Exam Triage Vital Signs ED Triage Vitals  Enc Vitals Group     BP 03/06/18 1151 102/67     Pulse Rate 03/06/18 1151 63     Resp 03/06/18 1151 16     Temp 03/06/18 1151 97.7 F (36.5 C)     Temp Source 03/06/18 1151 Oral     SpO2 03/06/18 1151 95 %     Weight 03/06/18 1147 147 lb (66.7 kg)     Height 03/06/18 1147 5\' 5"  (1.651 m)     Head Circumference --      Peak Flow --      Pain Score 03/06/18 1147 7     Pain Loc --      Pain Edu? --      Excl. in GC? --    No data found.  Updated Vital Signs BP 102/67 (BP Location: Right Arm)   Pulse 63   Temp 97.7 F (36.5 C) (Oral)   Resp 16   Ht 5\' 5"  (1.651 m)   Wt 66.7 kg   SpO2 95%   BMI 24.46 kg/m   Visual Acuity Right Eye Distance:   Left Eye Distance:   Bilateral Distance:    Right Eye Near:   Left  Eye Near:    Bilateral Near:     Physical Exam  Constitutional: He appears well-developed and well-nourished. No distress.  Skin: He is not diaphoretic.  approx 10 x 6 cm superficial skin abrasion on left upper arm; no drainage; slight surrounding erythema and tenderness to palpation  Nursing note and vitals reviewed.    UC Treatments / Results  Labs (all labs ordered are listed, but only abnormal results are displayed) Labs Reviewed - No data to display  EKG None  Radiology No results found.  Procedures Procedures (including critical care time)  Medications Ordered in UC Medications - No data to display  Initial Impression / Assessment and Plan / UC Course  I have reviewed the triage vital signs and the nursing notes.  Pertinent labs & imaging results that were available during my care of the patient were reviewed by me and considered in my medical decision making (see chart for details).      Final Clinical Impressions(s) / UC Diagnoses   Final diagnoses:  Abrasion of skin with infection  (mild secondary infection)   Discharge Instructions     Continue routine wound care with topical  antibiotic    ED Prescriptions    Medication Sig Dispense Auth. Provider   doxycycline (VIBRA-TABS) 100 MG tablet Take 1 tablet (100 mg total) by mouth 2 (two) times daily. 14 tablet Evelyn Aguinaldo, Pamala Hurry, MD   bacitracin ointment Apply 1 application topically 2 (two) times daily. 120 g Payton Mccallum, MD     1. diagnosis reviewed with patient 2. rx as per orders above; reviewed possible side effects, interactions, risks and benefits  3. Recommend supportive treatment with continued wound care 4. Follow-up prn if symptoms worsen or don't improve    Controlled Substance Prescriptions [place] Controlled Substance Registry consulted? Not Applicable   Payton Mccallum, MD 03/06/18 1451

## 2018-03-06 NOTE — Discharge Instructions (Signed)
Continue routine wound care with topical antibiotic

## 2018-08-08 ENCOUNTER — Emergency Department: Payer: Medicare Other

## 2018-08-08 ENCOUNTER — Emergency Department
Admission: EM | Admit: 2018-08-08 | Discharge: 2018-08-09 | Disposition: A | Discharge status: Home / Self Care | Payer: Medicare Other | Attending: Emergency Medicine | Admitting: Emergency Medicine

## 2018-08-08 ENCOUNTER — Other Ambulatory Visit: Payer: Self-pay

## 2018-08-08 DIAGNOSIS — S63283A Dislocation of proximal interphalangeal joint of left middle finger, initial encounter: Secondary | ICD-10-CM | POA: Diagnosis not present

## 2018-08-08 DIAGNOSIS — Y998 Other external cause status: Secondary | ICD-10-CM | POA: Diagnosis not present

## 2018-08-08 DIAGNOSIS — Y9389 Activity, other specified: Secondary | ICD-10-CM | POA: Diagnosis not present

## 2018-08-08 DIAGNOSIS — S63289A Dislocation of proximal interphalangeal joint of unspecified finger, initial encounter: Secondary | ICD-10-CM

## 2018-08-08 DIAGNOSIS — Z8673 Personal history of transient ischemic attack (TIA), and cerebral infarction without residual deficits: Secondary | ICD-10-CM | POA: Diagnosis not present

## 2018-08-08 DIAGNOSIS — R9082 White matter disease, unspecified: Secondary | ICD-10-CM | POA: Diagnosis not present

## 2018-08-08 DIAGNOSIS — W19XXXA Unspecified fall, initial encounter: Secondary | ICD-10-CM | POA: Insufficient documentation

## 2018-08-08 DIAGNOSIS — Z79899 Other long term (current) drug therapy: Secondary | ICD-10-CM | POA: Insufficient documentation

## 2018-08-08 DIAGNOSIS — Y92129 Unspecified place in nursing home as the place of occurrence of the external cause: Secondary | ICD-10-CM | POA: Insufficient documentation

## 2018-08-08 DIAGNOSIS — S51012A Laceration without foreign body of left elbow, initial encounter: Secondary | ICD-10-CM

## 2018-08-08 DIAGNOSIS — S6992XA Unspecified injury of left wrist, hand and finger(s), initial encounter: Secondary | ICD-10-CM | POA: Diagnosis present

## 2018-08-08 NOTE — ED Triage Notes (Addendum)
Pt arrived via [facility] from [facility] with c/o fall. EMS states pt had an unwitnessed fall while apparently trying to reach for his walker. EMS states that pt denies LOC but has obvious deformity to left hand and skin tear on left elbow.

## 2018-08-08 NOTE — ED Notes (Signed)
Family at bedside. 

## 2018-08-08 NOTE — ED Provider Notes (Addendum)
[facility] Emergency Department Provider Note   ____________________________________________    I have reviewed the triage vital signs and the nursing notes.   HISTORY  Chief Complaint Fall   History limited by dementia  HPI Randy Lester is a 85 y.o. male who presents after an unwitnessed fall from nursing home.  Power of attorney is here, he reports patient has frequent falls.  Apparently the patient was thought to be reaching for his walker and lost his balance and fell onto his left side.  Overall the patient says he feels well.  Denies chest wall pain, no abdominal pain, no nausea or vomiting.  Has some pain in his left hand and an abrasion to his left elbow.  We do not have an up-to-date MAR at this time, Eliquis listed on his medications in epic however this seems unlikely to me given his frequent falls.  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 Stroke [facility])     There are no active problems to display for this patient.   Past Surgical History:  Procedure Laterality Date  . VASCULAR SURGERY      Prior to Admission medications   Medication Sig Start Date End Date Taking? Authorizing Provider  amiodarone (PACERONE) 200 MG tablet Take 200 mg by mouth daily.    [provider]  apixaban (ELIQUIS) 5 MG TABS tablet Take 5 mg by mouth daily.    [provider]  aspirin 81 MG chewable tablet Chew 81 mg by mouth daily.    [provider]  atorvastatin (LIPITOR) 40 MG tablet Take 40 mg by mouth daily.    [provider]  bacitracin ointment Apply 1 application topically 2 (two) times daily. 03/06/18   Payton Mccallum, MD  doxycycline (VIBRA-TABS) 100 MG tablet Take 1 tablet (100 mg total) by mouth 2 (two) times daily. 03/06/18   Payton Mccallum, MD  finasteride (PROSCAR) 5 MG tablet Take 5 mg by mouth daily.    [provider]  tamsulosin  (FLOMAX) 0.4 MG CAPS capsule Take 0.4 mg by mouth.    [provider]     Allergies Penicillins  History reviewed. No pertinent family history.  Social History Social History   Tobacco Use  . Smoking status: Never Smoker  . Smokeless tobacco: Never Used  Substance Use Topics  . Alcohol use: No  . Drug use: No    Review of Systems  Constitutional: No fever/chills Eyes: No visual changes.  ENT: No neck pain Cardiovascular: Denies chest pain. Respiratory: No difficulty breathing Gastrointestinal: No abdominal pain.    Genitourinary: Negative for groin injury Musculoskeletal: Negative for back pain.  Left hand pain Skin: Skin tear left elbow Neurological: No new weakness   ____________________________________________   PHYSICAL EXAM:  VITAL SIGNS: ED Triage Vitals [08/08/18 2243]  Enc Vitals Group     BP      Pulse      Resp      Temp      Temp src      SpO2      Weight 73.5 kg (162 lb)     Height 1.778 m (5\' 10" )     Head Circumference      Peak Flow      Pain Score 3     Pain Loc      Pain Edu?      Excl. in GC?  Constitutional: Alert. No acute distress. Pleasant and interactive Eyes: Conjunctivae are normal.  Head: Bruise to the crown, appears old Nose: No epistaxis or swelling Mouth/Throat: Mucous membranes are moist.   Neck:  Painless ROM, no vertebral tenderness to palpation cardiovascular: Normal rate. Grossly normal heart sounds.  Good peripheral circulation. Respiratory: Normal respiratory effort.  No retractions. Lungs CTAB. Gastrointestinal: Soft and nontender. No distention.   Musculoskeletal:   Warm and well perfused.  Left hand: Third finger suspect dislocation DIP.  No pain with axial load on both hips, full range of motion of all extremities without pain Neurologic:  Normal speech and language. No gross focal neurologic deficits are appreciated.  Skin:  Skin is warm, dry.  Skin tear to the left elbow Psychiatric: Mood and  affect are normal. Speech and behavior are normal.  ____________________________________________   LABS (all labs ordered are listed, but only abnormal results are displayed)  Labs Reviewed - No data to display ____________________________________________  EKG  ED ECG REPORT I, Jene Every, the attending physician, personally viewed and interpreted this ECG.  Date: 08/08/2018  Rhythm: normal sinus rhythm QRS Axis: normal Intervals: normal ST/T Wave abnormalities: Nonspecific changes   ____________________________________________  RADIOLOGY  CT Hand x-ray ____________________________________________   PROCEDURES  Procedure(s) performed: No  .Ortho Injury Treatment Date/Time: 08/09/2018 12:06 AM Performed by: Jene Every, MD Authorized by: Jene Every, MD   Consent:    Consent obtained:  Verbal   Consent given by:  Patient   Risks discussed:  Fracture and recurrent dislocation   Alternatives discussed:  No treatment and immobilizationInjury location: finger Location details: left long finger Injury type: dislocation Pre-procedure neurovascular assessment: neurovascularly intact Pre-procedure distal perfusion: normal Pre-procedure neurological function: normal Pre-procedure range of motion: normal Post-procedure neurovascular assessment: post-procedure neurovascularly intact Post-procedure distal perfusion: normal Post-procedure neurological function: normal Post-procedure range of motion: normal Patient tolerance: Patient tolerated the procedure well with no immediate complications      Critical Care performed: No ____________________________________________   INITIAL IMPRESSION / ASSESSMENT AND PLAN / ED COURSE  Pertinent labs & imaging results that were available during my care of the patient were reviewed by me and considered in my medical decision making (see chart for details).  Patient overall well-appearing and in no acute distress,  unwitnessed fall, questionable Eliquis, will obtain CT, x-ray of the left hand anticipate dislocation that will require relocation  X-ray confirmed left third PIP dislocation, reduction performed without difficulty, patient tolerated well.  CT negative.  Patient appropriate for discharge at this time    ____________________________________________   FINAL CLINICAL IMPRESSION(S) / ED DIAGNOSES  Final diagnoses:  Fall, initial encounter  Dislocation of finger PIP joint, initial encounter  Skin tear of left elbow without complication, initial encounter        Note:  This document was prepared using Dragon voice recognition software and may include unintentional dictation errors.   Jene Every, MD 08/09/18 Jorje Guild    Jene Every, MD 08/09/18 [id number]

## 2018-08-09 NOTE — ED Notes (Signed)
Family verbalized understanding of d/c instructions and f/u care, reports has been called to [facility]. Dressing applied to skin tear on left elbow prior to d/c.Pt off the floor with [facility] back to facility at this time.

## 2018-10-31 ENCOUNTER — Emergency Department: Payer: Medicare Other

## 2018-10-31 ENCOUNTER — Emergency Department
Admission: EM | Admit: 2018-10-31 | Discharge: 2018-10-31 | Disposition: A | Discharge status: Skilled Nursing Facility | Payer: Medicare Other | Attending: Emergency Medicine | Admitting: Emergency Medicine

## 2018-10-31 ENCOUNTER — Encounter: Payer: Self-pay | Admitting: Emergency Medicine

## 2018-10-31 ENCOUNTER — Other Ambulatory Visit: Payer: Self-pay

## 2018-10-31 DIAGNOSIS — Y999 Unspecified external cause status: Secondary | ICD-10-CM | POA: Insufficient documentation

## 2018-10-31 DIAGNOSIS — Y929 Unspecified place or not applicable: Secondary | ICD-10-CM | POA: Insufficient documentation

## 2018-10-31 DIAGNOSIS — S0101XA Laceration without foreign body of scalp, initial encounter: Secondary | ICD-10-CM | POA: Diagnosis not present

## 2018-10-31 DIAGNOSIS — W19XXXA Unspecified fall, initial encounter: Secondary | ICD-10-CM | POA: Insufficient documentation

## 2018-10-31 DIAGNOSIS — S0990XA Unspecified injury of head, initial encounter: Secondary | ICD-10-CM

## 2018-10-31 DIAGNOSIS — Z8673 Personal history of transient ischemic attack (TIA), and cerebral infarction without residual deficits: Secondary | ICD-10-CM | POA: Diagnosis not present

## 2018-10-31 DIAGNOSIS — Z9181 History of falling: Secondary | ICD-10-CM | POA: Insufficient documentation

## 2018-10-31 DIAGNOSIS — Z79899 Other long term (current) drug therapy: Secondary | ICD-10-CM | POA: Diagnosis not present

## 2018-10-31 DIAGNOSIS — Y939 Activity, unspecified: Secondary | ICD-10-CM | POA: Diagnosis not present

## 2018-10-31 DIAGNOSIS — Z7982 Long term (current) use of aspirin: Secondary | ICD-10-CM | POA: Insufficient documentation

## 2018-10-31 NOTE — ED Triage Notes (Signed)
Pt presents to ED via [facility] with c/o unwitnessed fall. Per EMS pt has hx of frequent falls. Pt with noted bruising to bilateral arms and a laceration to back of head. Bleeding controlled at this time. Per EMS pt from [facility] at this time.

## 2018-10-31 NOTE — ED Notes (Signed)
This RN to bedside due to patient stating he needed to pee. Pt peed out very little. MD applied dermabond to posterior head. Pt tolerated well.

## 2018-10-31 NOTE — ED Notes (Signed)
NAD noted at time of D/C. Pt D/C back to [facility] via [facility]. Pt sent back with DNR form and D/C instructions. Report given to Bonita Quin, Receiving RN at facility.

## 2018-10-31 NOTE — ED Notes (Signed)
Per Jamesetta So at [facility], pt will have to be transferred back by EMS.

## 2018-10-31 NOTE — ED Provider Notes (Addendum)
[facility] Emergency Department Provider Note  ____________________________________________   I have reviewed the triage vital signs and the nursing notes. Where available I have reviewed prior notes and, if possible and indicated, outside hospital notes.    HISTORY  Chief Complaint Fall    HPI Randy Lester is a 85 y.o. male with a history of frequent falls, had a unwitnessed fall today.  No no loss of consciousness at his baseline according to EMS.  Does a DNR status.  Has a small laceration to the back of his head.  He has no complaints and would like to go home.   Did try calling the number have for next of kin, no one answered.   Past Medical History:  Diagnosis Date  . Stroke [facility])     There are no active problems to display for this patient.   Past Surgical History:  Procedure Laterality Date  . VASCULAR SURGERY      Prior to Admission medications   Medication Sig Start Date End Date Taking? Authorizing Provider  acetaminophen (TYLENOL) 325 MG tablet Take 650 mg by mouth every 4 (four) hours as needed for mild pain or fever.   Yes [provider]  acetaminophen (TYLENOL) 500 MG tablet Take 500 mg by mouth 3 (three) times daily.   Yes [provider]  amiodarone (PACERONE) 200 MG tablet Take 200 mg by mouth at bedtime.    Yes [provider]  aspirin 81 MG chewable tablet Chew 81 mg by mouth daily.   Yes [provider]  dexamethasone (DECADRON) 2 MG tablet Take 2 mg by mouth daily.   Yes [provider]  finasteride (PROSCAR) 5 MG tablet Take 5 mg by mouth daily.   Yes [provider]  furosemide (LASIX) 40 MG tablet Take 40 mg by mouth daily.   Yes [provider]  Melatonin 5 MG TABS Take 10 mg by mouth at bedtime.   Yes [provider]  polyethylene glycol (MIRALAX / GLYCOLAX) 17 g packet Take 17 g by mouth 2 (two) times daily.   Yes [provider]   potassium chloride SA (K-DUR) 20 MEQ tablet Take 20 mEq by mouth 2 (two) times daily.   Yes [provider]  QUEtiapine (SEROQUEL) 25 MG tablet Take 25 mg by mouth daily.   Yes [provider]  senna (SENOKOT) 8.6 MG TABS tablet Take 1 tablet by mouth 2 (two) times daily.   Yes [provider]  tamsulosin (FLOMAX) 0.4 MG CAPS capsule Take 0.4 mg by mouth at bedtime.    Yes [provider]  traMADol (ULTRAM) 50 MG tablet Take 50 mg by mouth every 4 (four) hours as needed for moderate pain.   Yes [provider]    Allergies Penicillins  History reviewed. No pertinent family history.  Social History Social History   Tobacco Use  . Smoking status: Never Smoker  . Smokeless tobacco: Never Used  Substance Use Topics  . Alcohol use: No  . Drug use: No    Review of Systems See HPI otherwise negative   ____________________________________________   PHYSICAL EXAM:  VITAL SIGNS: ED Triage Vitals  Enc Vitals Group     BP 10/31/18 1406 (!) 107/48     Pulse Rate 10/31/18 1406 72     Resp 10/31/18 1406 (!) 22     Temp 10/31/18 1406 (!) 97.5 F (36.4 C)     Temp Source 10/31/18 1406 Oral  SpO2 10/31/18 1406 100 %     Weight 10/31/18 1403 150 lb (68 kg)     Height 10/31/18 1403  (1.676 m)     Head Circumference --      Peak Flow --      Pain Score --      Pain Loc --      Pain Edu? --      Excl. in GC? --     Constitutional: Alert and oriented name and place unsure of the date. Well appearing and in no acute distress. Eyes: Conjunctivae are normal Head: 1 cm laceration to the occiput bleeding controlled HEENT: No congestion/rhinnorhea. Mucous membranes are moist.  Oropharynx non-erythematous Neck:   Nontender with no meningismus, no masses, no stridor Cardiovascular: Normal rate, regular rhythm. Grossly normal heart sounds.  Good peripheral circulation. Respiratory: Normal respiratory effort.  No retractions. Lungs  CTAB. Abdominal: Soft and nontender. No distention. No guarding no rebound Back:  There is no focal tenderness or step off.  there is no midline tenderness there are no lesions noted. there is no CVA tenderness Musculoskeletal: No lower extremity tenderness, no upper extremity tenderness. No joint effusions, no DVT signs strong distal pulses no edema no evidence of hip fracture arm fracture etc.  Can fully range all of his joints when he does not complain of pain Neurologic:  Normal speech and language. No gross focal neurologic deficits are appreciated.  Skin:  Skin is warm, dry and intact.  Holdable old bruising noted on arms. Psychiatric: Mood and affect are normal. Speech and behavior are normal.  ____________________________________________   LABS (all labs ordered are listed, but only abnormal results are displayed)  Labs Reviewed - No data to display  Pertinent labs  results that were available during my care of the patient were reviewed by me and considered in my medical decision making (see chart for details). ____________________________________________  EKG  I personally interpreted any EKGs ordered by me or triage  ____________________________________________  RADIOLOGY  Pertinent labs & imaging results that were available during my care of the patient were reviewed by me and considered in my medical decision making (see chart for details). If possible, patient and/or family made aware of any abnormal findings.  Ct Head Wo Contrast  Result Date: 10/31/2018 CLINICAL DATA:  Head laceration after unwitnessed fall. EXAM: CT HEAD WITHOUT CONTRAST CT CERVICAL SPINE WITHOUT CONTRAST TECHNIQUE: Multidetector CT imaging of the head and cervical spine was performed following the standard protocol without intravenous contrast. Multiplanar CT image reconstructions of the cervical spine were also generated. COMPARISON:  CT scan of August 08, 2018. CT scan of October 19, 2017. FINDINGS: CT  HEAD FINDINGS Brain: Mild diffuse cortical atrophy is noted. Mild chronic ischemic white matter disease is noted. Old right basal ganglia infarction is noted. No mass effect or midline shift is noted. Ventricular size is within normal limits. There is no evidence of mass lesion, hemorrhage or acute infarction. Vascular: No hyperdense vessel or unexpected calcification. Skull: Normal. Negative for fracture or focal lesion. Sinuses/Orbits: No acute finding. Other: None. CT CERVICAL SPINE FINDINGS Alignment: Normal. Skull base and vertebrae: No fracture is noted. However, there is interval development of multiple sclerotic densities throughout the cervical spine, including both vertebral bodies and posterior elements. Largest lesions are seen in C2 vertebral body, C4 vertebral body, C6 vertebral body as well as in the vertebral body and posterior spinous process of T1. These are concerning for metastatic disease. Soft tissues and spinal  canal: No prevertebral fluid or swelling. No visible canal hematoma. Disc levels: Severe degenerative disc disease is noted at C3-4, C5-6 and C6-7. Upper chest: Negative. Other: None. IMPRESSION: Mild diffuse cortical atrophy. Mild chronic ischemic white matter disease. No acute intracranial abnormality noted. Interval development of multiple sclerotic densities are noted throughout the cervical spine most consistent with osseous metastatic disease. Clinical correlation is recommended. Severe multilevel degenerative disc disease is noted in the cervical spine. No fracture or spondylolisthesis is noted. Electronically Signed   By: Lupita Raider M.D.   On: 10/31/2018 15:00   Ct Cervical Spine Wo Contrast  Result Date: 10/31/2018 CLINICAL DATA:  Head laceration after unwitnessed fall. EXAM: CT HEAD WITHOUT CONTRAST CT CERVICAL SPINE WITHOUT CONTRAST TECHNIQUE: Multidetector CT imaging of the head and cervical spine was performed following the standard protocol without intravenous  contrast. Multiplanar CT image reconstructions of the cervical spine were also generated. COMPARISON:  CT scan of August 08, 2018. CT scan of October 19, 2017. FINDINGS: CT HEAD FINDINGS Brain: Mild diffuse cortical atrophy is noted. Mild chronic ischemic white matter disease is noted. Old right basal ganglia infarction is noted. No mass effect or midline shift is noted. Ventricular size is within normal limits. There is no evidence of mass lesion, hemorrhage or acute infarction. Vascular: No hyperdense vessel or unexpected calcification. Skull: Normal. Negative for fracture or focal lesion. Sinuses/Orbits: No acute finding. Other: None. CT CERVICAL SPINE FINDINGS Alignment: Normal. Skull base and vertebrae: No fracture is noted. However, there is interval development of multiple sclerotic densities throughout the cervical spine, including both vertebral bodies and posterior elements. Largest lesions are seen in C2 vertebral body, C4 vertebral body, C6 vertebral body as well as in the vertebral body and posterior spinous process of T1. These are concerning for metastatic disease. Soft tissues and spinal canal: No prevertebral fluid or swelling. No visible canal hematoma. Disc levels: Severe degenerative disc disease is noted at C3-4, C5-6 and C6-7. Upper chest: Negative. Other: None. IMPRESSION: Mild diffuse cortical atrophy. Mild chronic ischemic white matter disease. No acute intracranial abnormality noted. Interval development of multiple sclerotic densities are noted throughout the cervical spine most consistent with osseous metastatic disease. Clinical correlation is recommended. Severe multilevel degenerative disc disease is noted in the cervical spine. No fracture or spondylolisthesis is noted. Electronically Signed   By: Lupita Raider M.D.   On: 10/31/2018 15:00   ____________________________________________    PROCEDURES  Procedure(s) performed: None  .Marland KitchenLaceration Repair Date/Time: 10/31/2018 5:04  PM Performed by: Jeanmarie Plant, MD Authorized by: Jeanmarie Plant, MD   Consent:    Consent obtained:  Verbal Anesthesia (see MAR for exact dosages):    Anesthesia method:  None Laceration details:    Location:  Scalp   Scalp location:  Occipital   Length (cm):  1   Depth (mm):  1 Pre-procedure details:    Preparation:  Patient was prepped and draped in usual sterile fashion and imaging obtained to evaluate for foreign bodies Exploration:    Wound exploration: wound explored through full range of motion     Contaminated: no   Treatment:    Area cleansed with:  Betadine   Amount of cleaning:  Standard   Visualized foreign bodies/material removed: no   Skin repair:    Repair method:  Tissue adhesive Approximation:    Approximation:  Close Post-procedure details:    Dressing:  Open (no dressing)   Patient tolerance of procedure:  Tolerated well, no  immediate complications    Critical Care performed: None  ____________________________________________   INITIAL IMPRESSION / ASSESSMENT AND PLAN / ED COURSE  Pertinent labs & imaging results that were available during my care of the patient were reviewed by me and considered in my medical decision making (see chart for details).  Patient with a history of multiple falls presents today after a fall.  No evidence of significant injury.  CT scan of head and neck are negative, there is a lot of patients in the COVID epidemic you are here in this hospital I think in this patient's best interest would be to go home.  No evidence of acute other pathology.   I talked to nephew who says pt at baseline and who agrees with mgt and d/c. Does not want work up or increased intervention.  states he was walking without his walker which is why he fell.  ----------------------------------------- 6:05 PM on 10/31/2018 ----------------------------------------- Patient ambulated with no difficulty using a walker here.     ____________________________________________   FINAL CLINICAL IMPRESSION(S) / ED DIAGNOSES  Final diagnoses:  None      This chart was dictated using voice recognition software.  Despite best efforts to proofread,  errors can occur which can change meaning.      Jeanmarie PlantMcShane, Trinika Cortese A, MD 10/31/18 1709    Jeanmarie PlantMcShane, Sabrin Dunlevy A, MD 10/31/18 [phone]

## 2018-12-25 DEATH — deceased

## 2020-06-27 IMAGING — CT CT HEAD WITHOUT CONTRAST
5 of 7 series · 17 of 47 positions shown, 18 images · non-contrast
Comparison: CT scan of August 08, 2018. CT scan of October 19, 2017.

CLINICAL DATA: Head laceration after unwitnessed fall.

EXAM:
CT HEAD WITHOUT CONTRAST
CT CERVICAL SPINE WITHOUT CONTRAST
TECHNIQUE: Multidetector CT imaging of the head and cervical spine was
performed following the standard protocol without intravenous
contrast. Multiplanar CT image reconstructions of the cervical spine
were also generated.

[Series 2: head wo · axial · 0.47mm/px · z∈[-93,-43]mm · 2 of 31 slices shown, 3 images]
[im 11/31  brain]
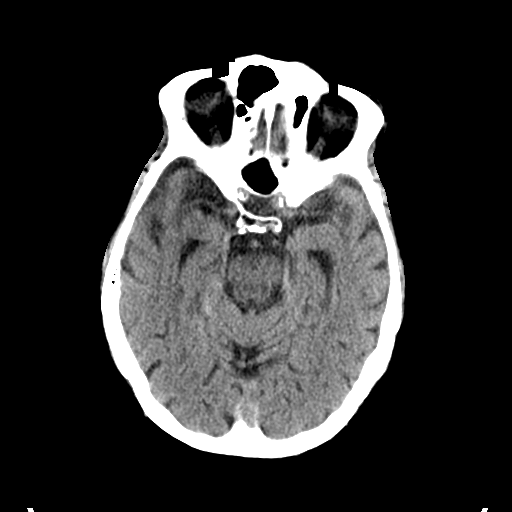
[im 11/31  bone]
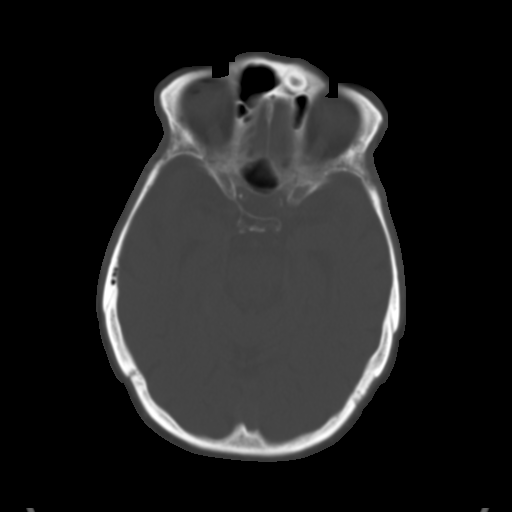
[im 21/31  brain]
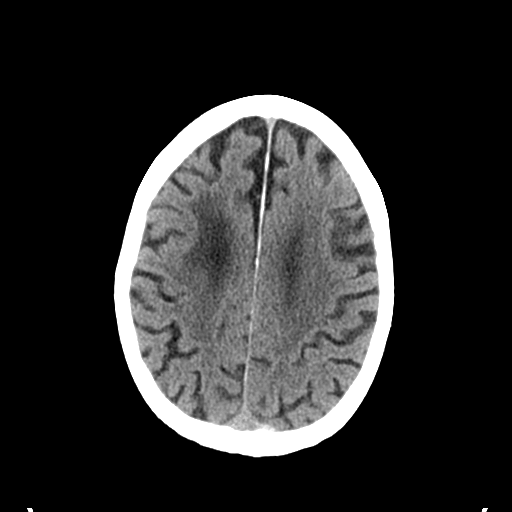

[Series 4: coronal soft tissue · coronal · 0.28mm/px · 3 of 68 slices shown]
[im 7/68  brain]
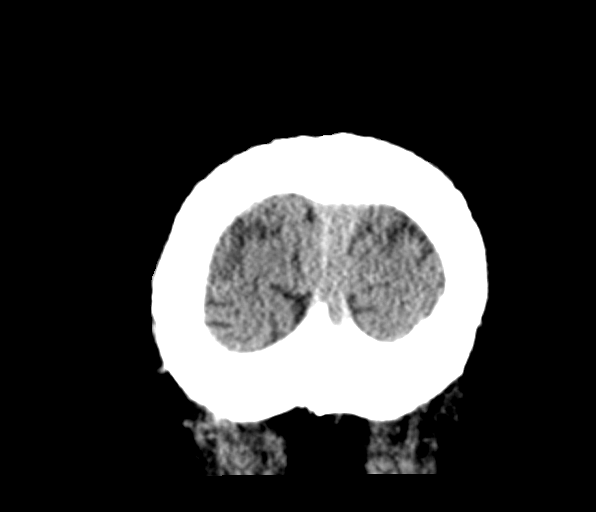
[im 11/68  brain]
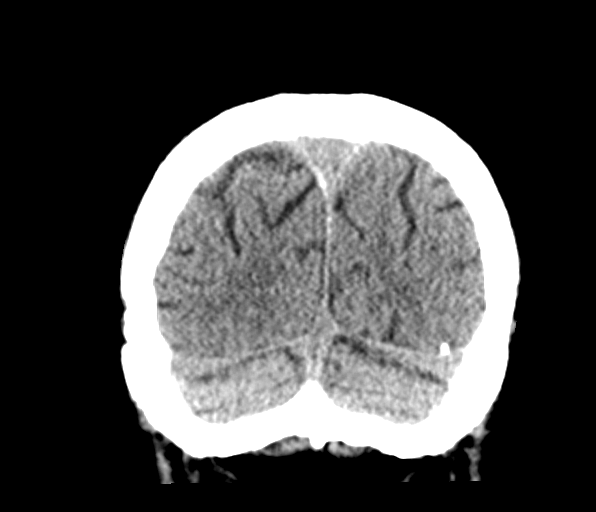
[im 14/68  brain]
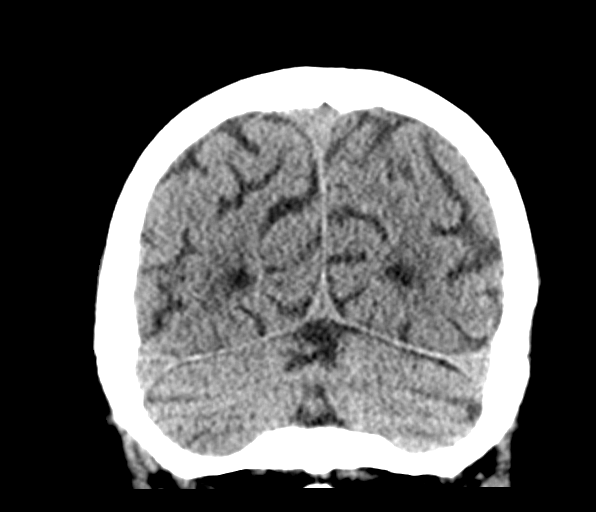

[Series 5: sagittal soft tissue · sagittal · 0.28mm/px · 1 of 55 slices shown]
[im 28/55  brain]
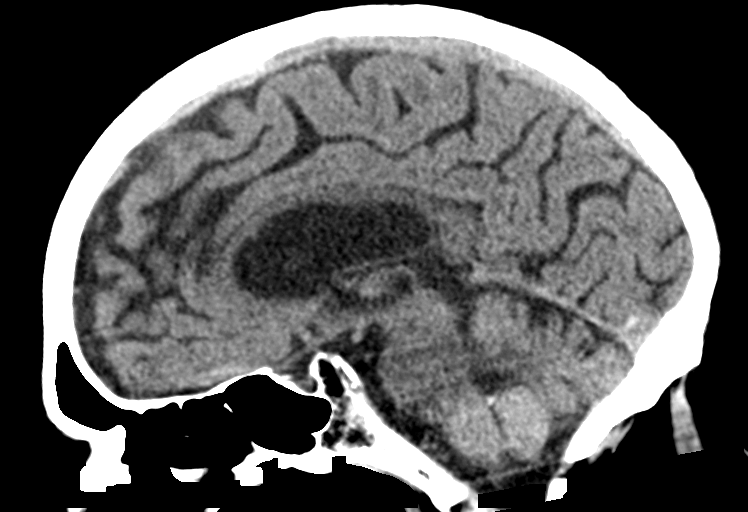

[Series 7: c spine soft · axial · 0.31mm/px · z∈[-291,-245]mm · 3 of 85 slices shown]
[im 8/85  brain]
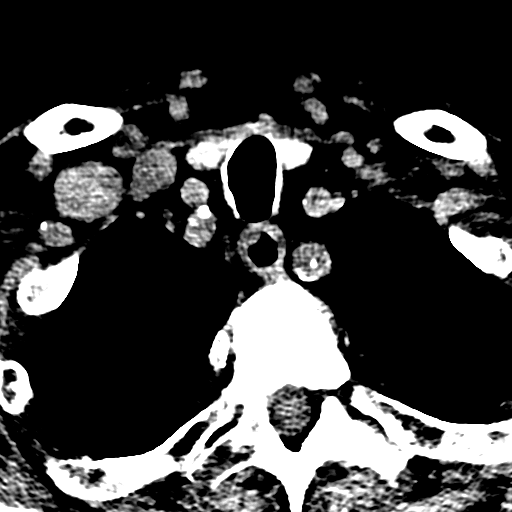
[im 16/85  brain]
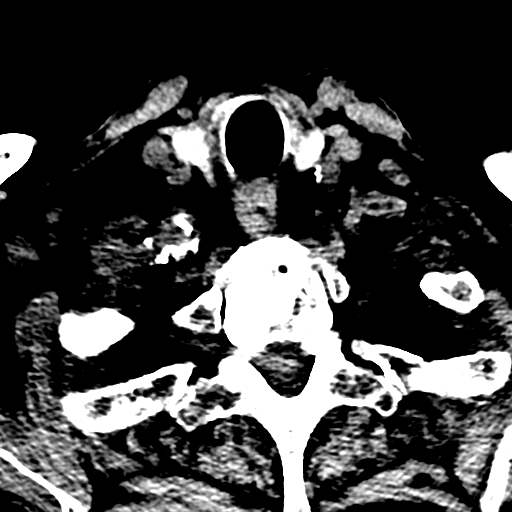
[im 31/85  brain]
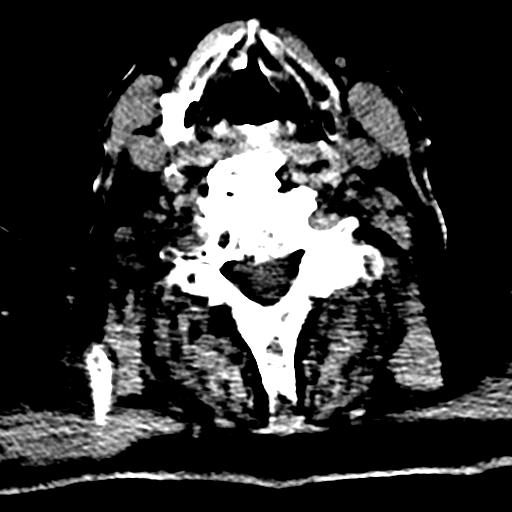

[Series 10: orthogonal bone · axial · 0.23mm/px · z∈[-324,-167]mm · 8 of 104 slices shown]
[im 8/104  bone]
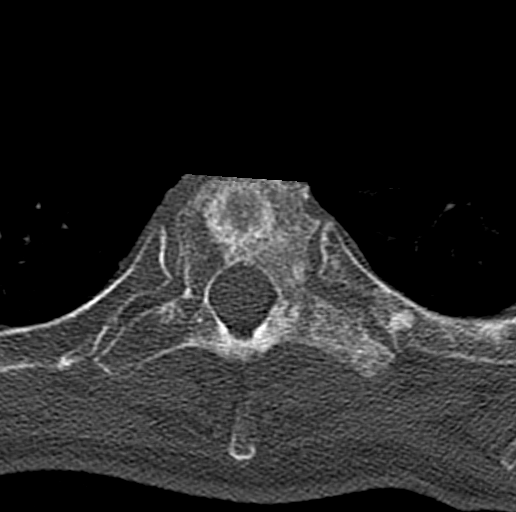
[im 24/104  bone]
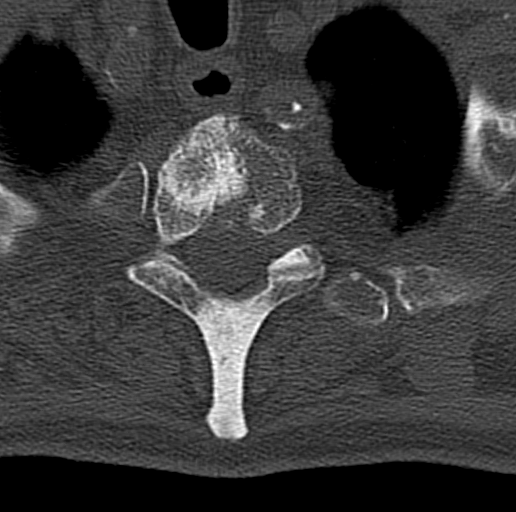
[im 32/104  bone]
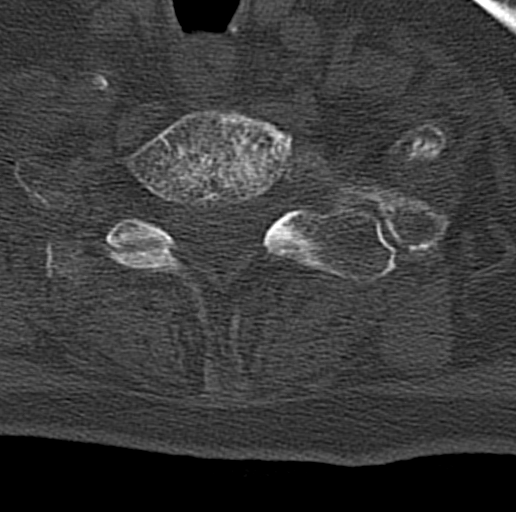
[im 48/104  bone]
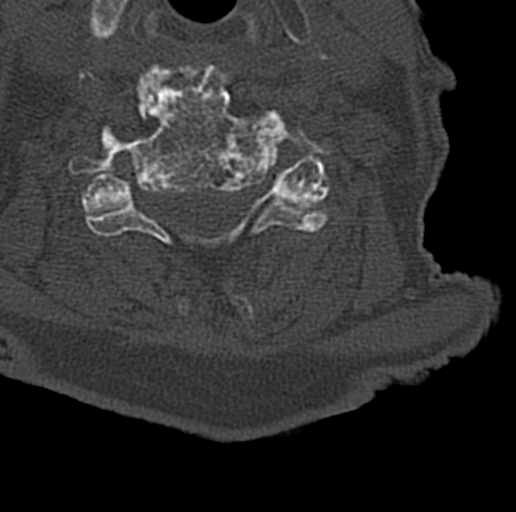
[im 56/104  bone]
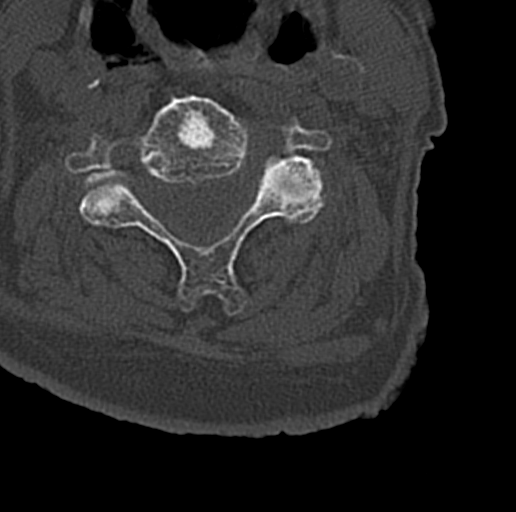
[im 72/104  bone]
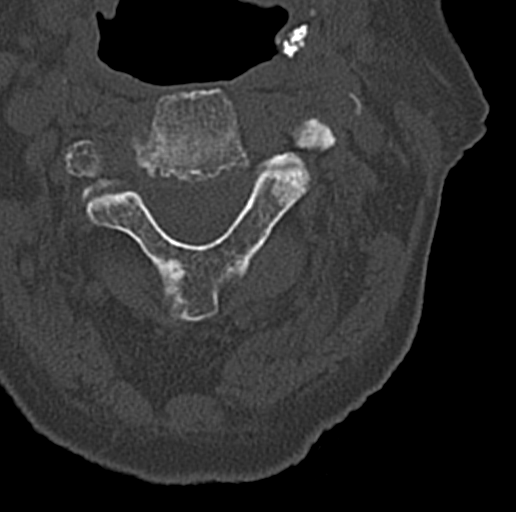
[im 80/104  bone]
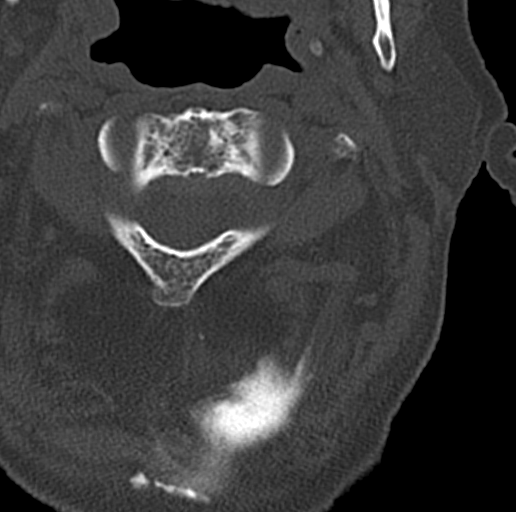
[im 96/104  bone]
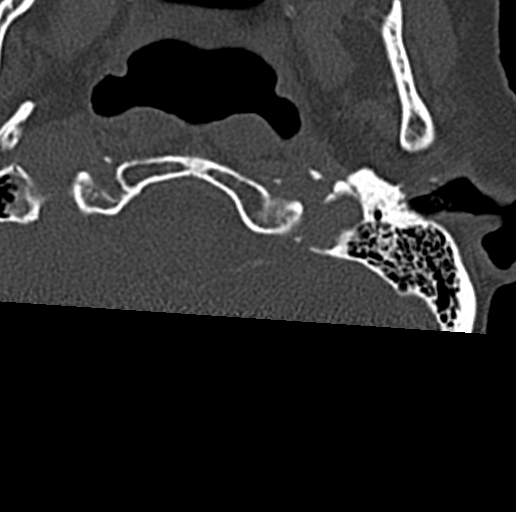

[17 of 47 positions shown; findings below may reference images not displayed]

FINDINGS: CT HEAD FINDINGS

Brain: Mild diffuse cortical atrophy is noted. Mild chronic ischemic
white matter disease is noted. Old right basal ganglia infarction is
noted. No mass effect or midline shift is noted. Ventricular size is
within normal limits. There is no evidence of mass lesion,
hemorrhage or acute infarction.

Vascular: No hyperdense vessel or unexpected calcification.

Skull: Normal. Negative for fracture or focal lesion.

Sinuses/Orbits: No acute finding.

Other: None.

CT CERVICAL SPINE FINDINGS

Alignment: Normal.

Skull base and vertebrae: No fracture is noted. However, there is
interval development of multiple sclerotic densities throughout the
cervical spine, including both vertebral bodies and posterior
elements. Largest lesions are seen in C2 vertebral body, C4
vertebral body, C6 vertebral body as well as in the vertebral body
and posterior spinous process of T1. These are concerning for
metastatic disease.

Soft tissues and spinal canal: No prevertebral fluid or swelling. No
visible canal hematoma.

Disc levels: Severe degenerative disc disease is noted at C3-4, C5-6
and C6-7.

Upper chest: Negative.

Other: None.
IMPRESSION: Mild diffuse cortical atrophy. Mild chronic ischemic white matter
disease. No acute intracranial abnormality noted.

Interval development of multiple sclerotic densities are noted
throughout the cervical spine most consistent with osseous
metastatic disease. Clinical correlation is recommended.

Severe multilevel degenerative disc disease is noted in the cervical
spine. No fracture or spondylolisthesis is noted.
